# Patient Record
Sex: Male | Born: 2000 | Race: White | Hispanic: Yes | Marital: Single | Smoking: Never smoker
Health system: Southern US, Community
[De-identification: ages and names within clinical notes are randomized; demographics above are authoritative.]

## PROBLEM LIST (undated history)

## (undated) DIAGNOSIS — D509 Iron deficiency anemia, unspecified: Secondary | ICD-10-CM

## (undated) DIAGNOSIS — E669 Obesity, unspecified: Secondary | ICD-10-CM

## (undated) DIAGNOSIS — E559 Vitamin D deficiency, unspecified: Secondary | ICD-10-CM

## (undated) DIAGNOSIS — K5900 Constipation, unspecified: Secondary | ICD-10-CM

## (undated) HISTORY — PX: CLUB FOOT RELEASE: SHX1363

## (undated) HISTORY — DX: Vitamin D deficiency, unspecified: E55.9

## (undated) HISTORY — DX: Constipation, unspecified: K59.00

## (undated) HISTORY — DX: Obesity, unspecified: E66.9

## (undated) HISTORY — DX: Iron deficiency anemia, unspecified: D50.9

---
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
## 2023-04-21 ENCOUNTER — Ambulatory Visit: Payer: Medicaid Other | Admitting: Medical
